# Patient Record
Sex: Female | Born: 1990 | Race: White | Hispanic: No | Marital: Married | Smoking: Current every day smoker
Health system: Southern US, Community
[De-identification: ages and names within clinical notes are randomized; demographics above are authoritative.]

## PROBLEM LIST (undated history)

## (undated) DIAGNOSIS — Q250 Patent ductus arteriosus: Secondary | ICD-10-CM

## (undated) HISTORY — PX: CARDIAC SURGERY: SHX584

## (undated) HISTORY — PX: PATENT DUCTUS ARTERIOUS REPAIR: SHX269

---

## 2013-06-25 ENCOUNTER — Observation Stay: Payer: Self-pay

## 2013-06-25 LAB — URINALYSIS, COMPLETE
Bilirubin,UR: NEGATIVE
Glucose,UR: NEGATIVE mg/dL (ref 0–75)
Ketone: NEGATIVE
Nitrite: NEGATIVE
Ph: 5 (ref 4.5–8.0)
Protein: 30
RBC,UR: 3 /HPF (ref 0–5)
Specific Gravity: 1.024 (ref 1.003–1.030)
Squamous Epithelial: 1
WBC UR: 5 /HPF (ref 0–5)

## 2013-07-01 ENCOUNTER — Emergency Department: Payer: Self-pay | Admitting: Emergency Medicine

## 2013-09-18 ENCOUNTER — Observation Stay: Payer: Self-pay

## 2013-09-27 ENCOUNTER — Inpatient Hospital Stay: Payer: Self-pay

## 2013-09-27 LAB — CBC WITH DIFFERENTIAL/PLATELET
Basophil %: 0.3 %
Lymphocyte %: 15.9 %
MCH: 29.2 pg (ref 26.0–34.0)
MCHC: 33.9 g/dL (ref 32.0–36.0)
MCV: 86 fL (ref 80–100)
Monocyte #: 1 x10 3/mm — ABNORMAL HIGH (ref 0.2–0.9)
Monocyte %: 5.5 %
Neutrophil #: 13.2 10*3/uL — ABNORMAL HIGH (ref 1.4–6.5)
Platelet: 375 10*3/uL (ref 150–440)

## 2013-09-28 LAB — GC/CHLAMYDIA PROBE AMP

## 2013-09-29 LAB — HEMATOCRIT: HCT: 27.2 % — ABNORMAL LOW (ref 35.0–47.0)

## 2014-03-10 ENCOUNTER — Emergency Department: Payer: Self-pay | Admitting: Emergency Medicine

## 2014-03-12 ENCOUNTER — Ambulatory Visit: Payer: Self-pay

## 2014-03-12 ENCOUNTER — Emergency Department: Payer: Self-pay | Admitting: Emergency Medicine

## 2014-08-11 ENCOUNTER — Emergency Department: Payer: Self-pay

## 2014-08-13 LAB — BETA STREP CULTURE(ARMC)

## 2015-02-28 ENCOUNTER — Emergency Department
Admit: 2015-02-28 | Disposition: A | Discharge status: Home / Self Care | Payer: Self-pay | Admitting: Emergency Medicine

## 2015-04-02 NOTE — H&P (Signed)
L&D Evaluation:  History:  HPI 24yo recently married WF sent over from office in active labor at 38 weeks, G3P2002, Normal PNC to date. GBS+, antibiotics administered.   Presents with contractions   Patient's Medical History PDA   Patient's Surgical History Correction of PDA at age 8   Medications Pre Serbiaatal Vitamins  Iron  Tylenol (Acetaminophen)   Allergies NKDA   Social History smoked in early pregnancy   Family History Non-Contributory   ROS:  ROS All systems were reviewed.  HEENT, CNS, GI, GU, Respiratory, CV, Renal and Musculoskeletal systems were found to be normal.   Exam:  Vital Signs stable   Urine Protein not completed   General no apparent distress   Mental Status clear   Chest clear   Heart normal sinus rhythm   Abdomen gravid, tender with contractions   Estimated Fetal Weight Average for gestational age   Fetal Position vtx   Back no CVAT   Edema no edema   Pelvic 5.5/90/0   Mebranes Intact   FHT normal rate with no decels   Ucx irregular   Ucx Frequency 5 min   Length of each Contraction 60 seconds   Ucx Pain Scale 6   Impression:  Impression active labor   Plan:  Plan monitor contractions and for cervical change, antibiotics for GBBS prophylaxis, epidural   Electronic Signatures: Ulyses AmorBurr, Effa Yarrow N (CNM)  (Signed [phone]-Nov-14 20:41)  Authored: L&D Evaluation   Last Updated: 05-Nov-14 20:41 by Ulyses AmorBurr, Dametria Tuzzolino N (CNM)

## 2015-06-10 ENCOUNTER — Telehealth: Payer: Self-pay | Admitting: Obstetrics and Gynecology

## 2015-06-10 NOTE — Telephone Encounter (Signed)
Called pt she states the valtrex 500mg  is giving her headaches, she would like to go back on acyclovir if possible Also pt is requesting Adderall 20mg  she states she is having to take 2 tabs some of the days, she is going to make appt with you to discuss, would like to pick up rx 06/11/2015

## 2015-06-10 NOTE — Telephone Encounter (Signed)
Pt called and needed a script for adderoll, I didn't see it up front, do you know if [facility] has written this RX, also pt wanted a call back, she had some questions she wanted to you about

## 2015-06-11 ENCOUNTER — Other Ambulatory Visit: Payer: Self-pay | Admitting: Obstetrics and Gynecology

## 2015-06-11 DIAGNOSIS — F988 Other specified behavioral and emotional disorders with onset usually occurring in childhood and adolescence: Secondary | ICD-10-CM

## 2015-06-11 MED ORDER — AMPHETAMINE-DEXTROAMPHETAMINE 20 MG PO TABS: 20.0000 mg | ORAL_TABLET | Freq: Two times a day (BID) | ORAL | Status: DC

## 2015-06-11 MED ORDER — ACYCLOVIR 400 MG PO TABS: 400.0000 mg | ORAL_TABLET | Freq: Two times a day (BID) | ORAL | Status: DC

## 2015-06-11 NOTE — Telephone Encounter (Signed)
Please let her know Idid prescription for acyclovir (didn't see a pharmacy in system), but cannot write for the adderral without seeing her first, and I don't find any record of seeing her here in past

## 2015-06-11 NOTE — Telephone Encounter (Signed)
This is Maureen Hill best her last ov with you was 12/18/2014 Pt was advised she needs to make office visit for follow up, she would like rx for adderall until appt

## 2015-07-14 ENCOUNTER — Ambulatory Visit
Admission: EM | Admit: 2015-07-14 | Discharge: 2015-07-14 | Disposition: A | Discharge status: Home / Self Care | Payer: Self-pay | Attending: Family Medicine | Admitting: Family Medicine

## 2015-07-14 DIAGNOSIS — H109 Unspecified conjunctivitis: Secondary | ICD-10-CM

## 2015-07-14 MED ORDER — MOXIFLOXACIN HCL 0.5 % OP SOLN: 1.0000 [drp] | Freq: Three times a day (TID) | OPHTHALMIC | Status: DC

## 2015-07-14 NOTE — ED Notes (Signed)
Patient states that she is having left eye pain. Patient states pain started yesterday. Patient states that eye is red, painful and has been draining. She states that she has been wearing her contacts more than usual recently. Does not remember getting anything in to her eye.

## 2015-07-14 NOTE — ED Provider Notes (Signed)
CSN: [id number]     Arrival date & time 07/14/15  1336 History   First MD Initiated Contact with Patient 07/14/15 1435     Chief Complaint  Patient presents with  . Eye Pain   (Consider location/radiation/quality/duration/timing/severity/associated sxs/prior Treatment) HPI Comments: 24 yo female with a 2 days h/o left eye redness, drainage and discomfort. Unknown sick contacts. Wears contact lenses. Denies any fevers, chills.  The history is provided by the patient.    No past medical history on file. Past Surgical History  Procedure Laterality Date  . Patent ductus arterious repair     No family history on file. Social History  Substance Use Topics  . Smoking status: Never Smoker   . Smokeless tobacco: None  . Alcohol Use: No   OB History    No data available     Review of Systems  Allergies  Review of patient's allergies indicates no known allergies.  Home Medications   Prior to Admission medications   Medication Sig Start Date End Date Taking? Authorizing Provider  acyclovir (ZOVIRAX) 400 MG tablet Take 1 tablet (400 mg total) by mouth 2 (two) times daily. 06/11/15  Yes Melody Elissa Lovett, CNM  amphetamine-dextroamphetamine (ADDERALL) 20 MG tablet Take 1 tablet (20 mg total) by mouth 2 (two) times daily. 06/11/15  Yes Melody Elissa Lovett, CNM  moxifloxacin (VIGAMOX) 0.5 % ophthalmic solution Place 1 drop into the left eye 3 (three) times daily. For 5 days 07/14/15   Payton Mccallum, MD   BP 95/69 mmHg  Pulse 119  Temp(Src) 98.3 F (36.8 C) (Tympanic)  Resp 16  Ht 5' 5.5" (1.664 m)  SpO2 100%  LMP 06/16/2015 Physical Exam  Constitutional: She appears well-developed and well-nourished. No distress.  Eyes: EOM are normal. Pupils are equal, round, and reactive to light. Lids are everted and swept, no foreign bodies found. Right eye exhibits no discharge. Left eye exhibits discharge. Right conjunctiva is not injected. Left conjunctiva is injected.  Neurological: She is alert.   Skin: She is not diaphoretic.  Nursing note and vitals reviewed.   ED Course  Procedures (including critical care time) Labs Review Labs Reviewed - No data to display  Imaging Review No results found.   MDM   1. Left conjunctivitis    Discharge Medication List as of 07/14/2015  2:54 PM    START taking these medications   Details  moxifloxacin (VIGAMOX) 0.5 % ophthalmic solution Place 1 drop into the left eye 3 (three) times daily. For 5 days, Starting 07/14/2015, Until Discontinued, Normal      Plan: 1.  diagnosis reviewed with patient 2. rx as per orders; risks, benefits, potential side effects reviewed with patient 3. Recommend supportive treatment with otc analgesics; no use of contact lens until condition resolved and then start with new pair of contact lenses (throw out old pair of lenses) 4. F/u prn if symptoms worsen or don't improve    Payton Mccallum, MD 07/14/15 [phone]

## 2015-07-19 ENCOUNTER — Telehealth: Payer: Self-pay | Admitting: Obstetrics and Gynecology

## 2015-07-19 NOTE — Telephone Encounter (Signed)
Needs refill for adderrall.

## 2015-07-22 NOTE — Telephone Encounter (Signed)
pls advise

## 2015-07-23 NOTE — Telephone Encounter (Signed)
Needs to be seen

## 2015-07-24 ENCOUNTER — Ambulatory Visit (INDEPENDENT_AMBULATORY_CARE_PROVIDER_SITE_OTHER): Payer: Self-pay | Admitting: Obstetrics and Gynecology

## 2015-07-24 ENCOUNTER — Encounter: Payer: Self-pay | Admitting: Obstetrics and Gynecology

## 2015-07-24 VITALS — BP 111/67 | HR 102 | Ht 66.0 in | Wt 110.4 lb

## 2015-07-24 DIAGNOSIS — F909 Attention-deficit hyperactivity disorder, unspecified type: Secondary | ICD-10-CM

## 2015-07-24 DIAGNOSIS — F988 Other specified behavioral and emotional disorders with onset usually occurring in childhood and adolescence: Secondary | ICD-10-CM

## 2015-07-24 MED ORDER — AMPHETAMINE-DEXTROAMPHETAMINE 20 MG PO TABS: 20.0000 mg | ORAL_TABLET | Freq: Two times a day (BID) | ORAL | Status: DC

## 2015-07-24 NOTE — Progress Notes (Signed)
Subjective:     Patient ID: [name], female   DOB: 11/16/1991, 24 y.o.   MRN: [id number]  HPI Here to discuss ADD medications  Review of Systems Feeling better on current dose. No complaints    Objective:   Physical Exam A&O x4 Well groomed thin female in no current distress    Assessment:     ADD under good control with current med     Plan:     Continue Adderall at  bid, rx given with 3 months RTC prn  Melody Ines Bloomer, CNM

## 2015-07-24 NOTE — Telephone Encounter (Signed)
Pt is coming in 07/24/2015

## 2015-08-06 ENCOUNTER — Encounter: Payer: Self-pay | Admitting: *Deleted

## 2015-08-06 ENCOUNTER — Emergency Department: Payer: Self-pay

## 2015-08-06 ENCOUNTER — Emergency Department
Admission: EM | Admit: 2015-08-06 | Discharge: 2015-08-06 | Disposition: A | Discharge status: Home / Self Care | Payer: Self-pay | Attending: Emergency Medicine | Admitting: Emergency Medicine

## 2015-08-06 DIAGNOSIS — Z79899 Other long term (current) drug therapy: Secondary | ICD-10-CM | POA: Insufficient documentation

## 2015-08-06 DIAGNOSIS — Z3202 Encounter for pregnancy test, result negative: Secondary | ICD-10-CM | POA: Insufficient documentation

## 2015-08-06 DIAGNOSIS — S161XXA Strain of muscle, fascia and tendon at neck level, initial encounter: Secondary | ICD-10-CM | POA: Insufficient documentation

## 2015-08-06 DIAGNOSIS — Y998 Other external cause status: Secondary | ICD-10-CM | POA: Insufficient documentation

## 2015-08-06 DIAGNOSIS — T148XXA Other injury of unspecified body region, initial encounter: Secondary | ICD-10-CM

## 2015-08-06 DIAGNOSIS — S40012A Contusion of left shoulder, initial encounter: Secondary | ICD-10-CM | POA: Insufficient documentation

## 2015-08-06 DIAGNOSIS — Y9389 Activity, other specified: Secondary | ICD-10-CM | POA: Insufficient documentation

## 2015-08-06 DIAGNOSIS — Y92410 Unspecified street and highway as the place of occurrence of the external cause: Secondary | ICD-10-CM | POA: Insufficient documentation

## 2015-08-06 DIAGNOSIS — Z792 Long term (current) use of antibiotics: Secondary | ICD-10-CM | POA: Insufficient documentation

## 2015-08-06 LAB — POCT PREGNANCY, URINE: Preg Test, Ur: NEGATIVE

## 2015-08-06 MED ORDER — IBUPROFEN 800 MG PO TABS
800.0000 mg | ORAL_TABLET | Freq: Once | ORAL | Status: AC
  Administered 2015-08-06: 800 mg via ORAL
  Filled 2015-08-06: qty 1

## 2015-08-06 MED ORDER — METHOCARBAMOL 500 MG PO TABS: 500.0000 mg | ORAL_TABLET | Freq: Four times a day (QID) | ORAL | Status: DC | PRN

## 2015-08-06 MED ORDER — IBUPROFEN 800 MG PO TABS: 800.0000 mg | ORAL_TABLET | Freq: Three times a day (TID) | ORAL | Status: DC | PRN
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
## 2015-09-29 ENCOUNTER — Encounter: Payer: Self-pay | Admitting: Gynecology

## 2015-09-29 ENCOUNTER — Ambulatory Visit
Admission: EM | Admit: 2015-09-29 | Discharge: 2015-09-29 | Disposition: A | Discharge status: Home / Self Care | Payer: Self-pay | Attending: Internal Medicine | Admitting: Internal Medicine

## 2015-09-29 DIAGNOSIS — H66002 Acute suppurative otitis media without spontaneous rupture of ear drum, left ear: Secondary | ICD-10-CM

## 2015-09-29 DIAGNOSIS — H6092 Unspecified otitis externa, left ear: Secondary | ICD-10-CM

## 2015-09-29 DIAGNOSIS — J069 Acute upper respiratory infection, unspecified: Secondary | ICD-10-CM

## 2015-09-29 HISTORY — DX: Patent ductus arteriosus: Q25.0

## 2015-09-29 LAB — RAPID INFLUENZA A&B ANTIGENS
Influenza A (ARMC): NOT DETECTED
Influenza B (ARMC): NOT DETECTED

## 2015-09-29 LAB — RAPID STREP SCREEN (MED CTR MEBANE ONLY): Streptococcus, Group A Screen (Direct): NEGATIVE

## 2015-09-29 MED ORDER — AMOXICILLIN 500 MG PO CAPS: 500.0000 mg | ORAL_CAPSULE | Freq: Two times a day (BID) | ORAL | Status: DC

## 2015-09-29 NOTE — ED Provider Notes (Signed)
CSN: [id number]     Arrival date & time 09/29/15  1250 History   First MD Initiated Contact with Patient 09/29/15 1450     Chief Complaint  Patient presents with  . Sore Throat  . Generalized Body Aches   HPI  Maureen CordiaMarissa Hill is a pleasant 24 y.o. female who presents with fever, sore throat, body aches and chills. She began to have symptoms yesterday. Her temp was 100. She reports her 26-year-old has been sick for 2 weeks and just got home from spending 4 days in the hospital. He was being treated for RSV, conjunctivitis, bronchiolitis, and pneumonia. Yesterday she started to have left ear pain and pressure along with the above symptoms. Pain is 10/10 currently. She denies abdominal pain, nausea, vomiting or rash. She has not tried any medications for her symptoms. Rest seems to make her feel a little better.  Cough is non-productive.   Past Medical History  Diagnosis Date  . PDA (patent ductus arteriosus)    Past Surgical History  Procedure Laterality Date  . Patent ductus arterious repair    . Cardiac surgery     History reviewed. No pertinent family history. Social History  Substance Use Topics  . Smoking status: Never Smoker   . Smokeless tobacco: None  . Alcohol Use: No   OB History    No data available     Review of Systems  Constitutional: Positive for fever, chills, diaphoresis, activity change, appetite change and fatigue. Negative for unexpected weight change.  HENT: Positive for congestion, ear pain, mouth sores, postnasal drip, rhinorrhea, sinus pressure, sneezing and sore throat. Negative for ear discharge, hearing loss, tinnitus and trouble swallowing.   Eyes: Negative.   Respiratory: Positive for cough.   Cardiovascular: Negative.   Gastrointestinal: Negative.   Genitourinary: Negative.   Musculoskeletal: Positive for myalgias.  Skin: Negative.   Psychiatric/Behavioral: Negative.     Allergies  Review of patient's allergies indicates no known  allergies.  Home Medications   Prior to Admission medications   Medication Sig Start Date End Date Taking? Authorizing Provider  acyclovir (ZOVIRAX) 400 MG tablet Take 1 tablet (400 mg total) by mouth 2 (two) times daily. 06/11/15  Yes Melody Elissa LovettN Burr, CNM  amphetamine-dextroamphetamine (ADDERALL) 20 MG tablet Take 1 tablet (20 mg total) by mouth 2 (two) times daily. 07/24/15  Yes Melody Elissa LovettN Burr, CNM  levonorgestrel (MIRENA) 20 MCG/24HR IUD 1 each by Intrauterine route once.   Yes Historical Provider, MD  amoxicillin (AMOXIL) 500 MG capsule Take 1 capsule (500 mg total) by mouth 2 (two) times daily. 09/29/15   Joselyn ArrowKandice L Aashna Matson, NP  ibuprofen (ADVIL,MOTRIN) 800 MG tablet Take 1 tablet (800 mg total) by mouth every 8 (eight) hours as needed. 08/06/15   Charmayne Sheerharles M Beers, PA-C  methocarbamol (ROBAXIN) 500 MG tablet Take 1 tablet (500 mg total) by mouth every 6 (six) hours as needed for muscle spasms. 08/06/15   Charmayne Sheerharles M Beers, PA-C  moxifloxacin (VIGAMOX) 0.5 % ophthalmic solution Place 1 drop into the left eye 3 (three) times daily. For 5 days 07/14/15   Payton Mccallumrlando Conty, MD   Meds Ordered and Administered this Visit  Medications - No data to display  BP 99/62 mmHg  Pulse 103  Temp(Src) 99.3 F (37.4 C) (Oral)  Resp 16  Ht 5\' 6"  (1.676 m)  Wt 115 lb (52.164 kg)  BMI 18.57 kg/m2  SpO2 100%  LMP 09/20/2015 No data found.   Physical Exam  Constitutional: She is oriented  to person, place, and time. She appears well-developed and well-nourished. No distress.  HENT:  Head: Normocephalic and atraumatic.  Right Ear: Hearing, tympanic membrane, external ear and ear canal normal.  Left Ear: Hearing and external ear normal. Tympanic membrane is injected, erythematous and bulging.  Nose: Mucosal edema and rhinorrhea present. Right sinus exhibits no maxillary sinus tenderness and no frontal sinus tenderness. Left sinus exhibits no maxillary sinus tenderness and no frontal sinus tenderness.  Mouth/Throat:  Uvula is midline and mucous membranes are normal. Posterior oropharyngeal erythema present. No oropharyngeal exudate or posterior oropharyngeal edema.  Left canal with beefy red erythema  Eyes: Conjunctivae are normal. Pupils are equal, round, and reactive to light. No scleral icterus.  Neck: Normal range of motion. Neck supple. No thyromegaly present.  Shotty tender anterior cervical adenopathy  Cardiovascular: Normal rate and regular rhythm.   Pulmonary/Chest: Effort normal and breath sounds normal. No respiratory distress. She has no wheezes. She has no rales.  Abdominal: Soft. Bowel sounds are normal. She exhibits no distension and no mass. There is no tenderness. There is no rebound and no guarding.  Musculoskeletal: Normal range of motion. She exhibits no edema or tenderness.  Neurological: She is alert and oriented to person, place, and time. No cranial nerve deficit.  Skin: Skin is warm and dry. No rash noted. No erythema.  Psychiatric: She has a normal mood and affect. Her behavior is normal. Judgment and thought content normal.  Nursing note and vitals reviewed.   ED Course  Procedures none  Labs Review Labs Reviewed  RAPID STREP SCREEN (NOT AT [facility])  RAPID INFLUENZA A&B ANTIGENS ([facility] ONLY)  CULTURE, GROUP A STREP ([facility] ONLY)   strep negative, influenza A and B negative MDM   1. Otitis externa, left   2. Acute suppurative otitis media of left ear without spontaneous rupture of tympanic membrane, recurrence not specified   3. URI (upper respiratory infection)    Plan: Test results and diagnosis reviewed with patient  Rx as per orders;  benefits, risks, potential side effects reviewed  Recommend supportive treatment with rest, fluids, tylenol as directed, Mucinex 1-2 tabs BID Seek additional medical care if symptoms worsen or are not improving    Joselyn Arrow, NP 09/29/15 1619

## 2015-09-29 NOTE — Discharge Instructions (Signed)
Rest, increase fluid intake, Tylenol as needed for fever and pain Mucinex one to 2 tablets twice daily for congestion  Otitis Externa Otitis externa is a germ infection in the outer ear. The outer ear is the area from the eardrum to the outside of the ear. Otitis externa is sometimes called "swimmer's ear." HOME CARE  Put drops in the ear as told by your doctor.  Only take medicine as told by your doctor.  If you have diabetes, your doctor may give you more directions. Follow your doctor's directions.  Keep all doctor visits as told. To avoid another infection:  Keep your ear dry. Use the corner of a towel to dry your ear after swimming or bathing.  Avoid scratching or putting things inside your ear.  Avoid swimming in lakes, dirty water, or pools that use a chemical called chlorine poorly.  You may use ear drops after swimming. Combine equal amounts of white vinegar and alcohol in a bottle. Put 3 or 4 drops in each ear. GET HELP IF:   You have a fever.  Your ear is still red, puffy (swollen), or painful after 3 days.  You still have yellowish-white fluid (pus) coming from the ear after 3 days.  Your redness, puffiness, or pain gets worse.  You have a really bad headache.  You have redness, puffiness, pain, or tenderness behind your ear. MAKE SURE YOU:   Understand these instructions.  Will watch your condition.  Will get help right away if you are not doing well or get worse.   This information is not intended to replace advice given to you by your health care provider. Make sure you discuss any questions you have with your health care provider.   Document Released: 04/27/2008 Document Revised: 11/30/2014 Document Reviewed: 11/26/2011 Elsevier Interactive Patient Education 2016 [employer]  Otitis Media With Effusion Otitis media with effusion is the presence of fluid in the middle ear. This is a common problem in children, which often follows ear infections. It  may be present for weeks or longer after the infection. Unlike an acute ear infection, otitis media with effusion refers only to fluid behind the ear drum and not infection. Children with repeated ear and sinus infections and allergy problems are the most likely to get otitis media with effusion. CAUSES  The most frequent cause of the fluid buildup is dysfunction of the eustachian tubes. These are the tubes that drain fluid in the ears to the back of the nose (nasopharynx). SYMPTOMS   The main symptom of this condition is hearing loss. As a result, you or your child may:  Listen to the TV at a loud volume.  Not respond to questions.  Ask "what" often when spoken to.  Mistake or confuse one sound or word for another.  There may be a sensation of fullness or pressure but usually not pain. DIAGNOSIS   Your health care provider will diagnose this condition by examining you or your child's ears.  Your health care provider may test the pressure in you or your child's ear with a tympanometer.  A hearing test may be conducted if the problem persists. TREATMENT   Treatment depends on the duration and the effects of the effusion.  Antibiotics, decongestants, nose drops, and cortisone-type drugs (tablets or nasal spray) may not be helpful.  Children with persistent ear effusions may have delayed language or behavioral problems. Children at risk for developmental delays in hearing, learning, and speech may require referral to a  specialist earlier than children not at risk.  You or your child's health care provider may suggest a referral to an ear, nose, and throat surgeon for treatment. The following may help restore normal hearing:  Drainage of fluid.  Placement of ear tubes (tympanostomy tubes).  Removal of adenoids (adenoidectomy). HOME CARE INSTRUCTIONS   Avoid secondhand smoke.  Infants who are breastfed are less likely to have this condition.  Avoid feeding infants while they  are lying flat.  Avoid known environmental allergens.  Avoid people who are sick. SEEK MEDICAL CARE IF:   Hearing is not better in 3 months.  Hearing is worse.  Ear pain.  Drainage from the ear.  Dizziness. MAKE SURE YOU:   Understand these instructions.  Will watch your condition.  Will get help right away if you are not doing well or get worse.   This information is not intended to replace advice given to you by your health care provider. Make sure you discuss any questions you have with your health care provider.   Document Released: 12/17/2004 Document Revised: 11/30/2014 Document Reviewed: 06/06/2013 Elsevier Interactive Patient Education [employer]

## 2015-09-29 NOTE — ED Notes (Signed)
Patient c/o body aces/ chills/ fever at home of 100 and sore throat x yesterday.

## 2015-10-01 NOTE — ED Notes (Signed)
Final report of strep testing negative  

## 2015-10-02 LAB — CULTURE, GROUP A STREP (THRC)

## 2015-10-16 ENCOUNTER — Other Ambulatory Visit: Payer: Self-pay | Admitting: Obstetrics and Gynecology

## 2015-10-16 ENCOUNTER — Telehealth: Payer: Self-pay | Admitting: Obstetrics and Gynecology

## 2015-10-16 MED ORDER — AMPHETAMINE-DEXTROAMPHETAMINE 20 MG PO TABS: 20.0000 mg | ORAL_TABLET | Freq: Two times a day (BID) | ORAL | Status: DC

## 2015-10-16 NOTE — Telephone Encounter (Signed)
This pt said she picks up a RX up front. I could not hear her well on the phone so I don't know what it is. Sorry She said she usually picks it up around the 30th.

## 2015-10-16 NOTE — Telephone Encounter (Signed)
Please let her know it is ready for pick up 

## 2015-10-21 ENCOUNTER — Other Ambulatory Visit: Payer: Self-pay | Admitting: *Deleted

## 2015-10-21 MED ORDER — NITROFURANTOIN MONOHYD MACRO 100 MG PO CAPS: 100.0000 mg | ORAL_CAPSULE | Freq: Two times a day (BID) | ORAL | Status: DC

## 2015-10-24 ENCOUNTER — Ambulatory Visit: Payer: Self-pay | Admitting: Obstetrics and Gynecology

## 2015-10-25 ENCOUNTER — Ambulatory Visit: Payer: Self-pay | Admitting: Obstetrics and Gynecology

## 2015-10-30 ENCOUNTER — Ambulatory Visit: Payer: Self-pay | Admitting: Obstetrics and Gynecology

## 2015-12-07 ENCOUNTER — Ambulatory Visit
Admission: EM | Admit: 2015-12-07 | Discharge: 2015-12-07 | Disposition: A | Discharge status: Home / Self Care | Payer: Self-pay | Attending: Family Medicine | Admitting: Family Medicine

## 2015-12-07 DIAGNOSIS — H6501 Acute serous otitis media, right ear: Secondary | ICD-10-CM

## 2015-12-07 MED ORDER — AMOXICILLIN 875 MG PO TABS: 875.0000 mg | ORAL_TABLET | Freq: Two times a day (BID) | ORAL | Status: DC

## 2015-12-07 NOTE — ED Provider Notes (Signed)
CSN: [id number]     Arrival date & time 12/07/15  1047 History   First MD Initiated Contact with Patient 12/07/15 1324     Chief Complaint  Patient presents with  . URI   (Consider location/radiation/quality/duration/timing/severity/associated sxs/prior Treatment) Patient is a 25 y.o. female presenting with URI. The history is provided by the patient.  URI Presenting symptoms: congestion, cough, ear pain, fever and rhinorrhea   Severity:  Moderate Onset quality:  Sudden Duration:  2 weeks Timing:  Constant Progression:  Worsening Chronicity:  New Relieved by:  Nothing Ineffective treatments:  OTC medications   Past Medical History  Diagnosis Date  . PDA (patent ductus arteriosus)    Past Surgical History  Procedure Laterality Date  . Patent ductus arterious repair    . Cardiac surgery     No family history on file. Social History  Substance Use Topics  . Smoking status: Never Smoker   . Smokeless tobacco: None  . Alcohol Use: No   OB History    No data available     Review of Systems  Constitutional: Positive for fever.  HENT: Positive for congestion, ear pain and rhinorrhea.   Respiratory: Positive for cough.     Allergies  Review of patient's allergies indicates no known allergies.  Home Medications   Prior to Admission medications   Medication Sig Start Date End Date Taking? Authorizing Provider  acyclovir (ZOVIRAX) 400 MG tablet Take 1 tablet (400 mg total) by mouth 2 (two) times daily. 06/11/15  Yes Melody N Shambley, CNM  amphetamine-dextroamphetamine (ADDERALL) 20 MG tablet Take 1 tablet (20 mg total) by mouth 2 (two) times daily. 10/16/15  Yes Melody Suzan Nailer, CNM  levonorgestrel (MIRENA) 20 MCG/24HR IUD 1 each by Intrauterine route once.   Yes Historical Provider, MD  amoxicillin (AMOXIL) 875 MG tablet Take 1 tablet (875 mg total) by mouth 2 (two) times daily. 12/07/15   Payton Mccallum, MD  ibuprofen (ADVIL,MOTRIN) 800 MG tablet Take 1 tablet (800 mg  total) by mouth every 8 (eight) hours as needed. 08/06/15   Charmayne Sheer Beers, PA-C  methocarbamol (ROBAXIN) 500 MG tablet Take 1 tablet (500 mg total) by mouth every 6 (six) hours as needed for muscle spasms. 08/06/15   Charmayne Sheer Beers, PA-C  moxifloxacin (VIGAMOX) 0.5 % ophthalmic solution Place 1 drop into the left eye 3 (three) times daily. For 5 days 07/14/15   Payton Mccallum, MD  nitrofurantoin, macrocrystal-monohydrate, (MACROBID) 100 MG capsule Take 1 capsule (100 mg total) by mouth 2 (two) times daily. 10/21/15   Melody Suzan Nailer, CNM   Meds Ordered and Administered this Visit  Medications - No data to display  BP 106/75 mmHg  Pulse 85  Temp(Src) 98.2 F (36.8 C) (Oral)  Resp 16  Ht 5\' 5"  (1.651 m)  Wt 105 lb (47.628 kg)  BMI 17.47 kg/m2  SpO2 100% No data found.   Physical Exam  Constitutional: She appears well-developed and well-nourished. No distress.  HENT:  Head: Normocephalic and atraumatic.  Right Ear: External ear and ear canal normal. Tympanic membrane is injected and bulging. A middle ear effusion is present.  Left Ear: Tympanic membrane, external ear and ear canal normal.  Nose: Rhinorrhea present. No mucosal edema, nose lacerations, sinus tenderness, nasal deformity, septal deviation or nasal septal hematoma. No epistaxis.  No foreign bodies.  Mouth/Throat: Uvula is midline and mucous membranes are normal. Posterior oropharyngeal erythema present. No oropharyngeal exudate, posterior oropharyngeal edema or tonsillar abscesses.  Eyes: Conjunctivae  and EOM are normal. Pupils are equal, round, and reactive to light. Right eye exhibits no discharge. Left eye exhibits no discharge. No scleral icterus.  Neck: Normal range of motion. Neck supple. No thyromegaly present.  Cardiovascular: Normal rate, regular rhythm and normal heart sounds.   Pulmonary/Chest: Effort normal and breath sounds normal. No respiratory distress. She has no wheezes. She has no rales.  Lymphadenopathy:     She has no cervical adenopathy.  Skin: She is not diaphoretic.  Nursing note and vitals reviewed.   ED Course  Procedures (including critical care time)  Labs Review Labs Reviewed - No data to display  Imaging Review No results found.   Visual Acuity Review  Right Eye Distance:   Left Eye Distance:   Bilateral Distance:    Right Eye Near:   Left Eye Near:    Bilateral Near:         MDM   1. Right acute serous otitis media, recurrence not specified    Discharge Medication List as of 12/07/2015  1:49 PM    START taking these medications   Details  amoxicillin (AMOXIL) 875 MG tablet Take 1 tablet (875 mg total) by mouth 2 (two) times daily., Starting 12/07/2015, Until Discontinued, Normal       1. diagnosis reviewed with patient 2. rx as per orders above; reviewed possible side effects, interactions, risks and benefits  3. Recommend supportive treatment with otc analgesics 4. Follow-up prn if symptoms worsen or don't improve    Payton Mccallumrlando Judiann Celia, MD 12/07/15 1438

## 2015-12-07 NOTE — ED Notes (Signed)
X 2 weeks. Bilateral otalgia. Dry cough, hoarseness, chest congestion. Pt states she was febrile "a couple days ago"; temperature was 101.

## 2016-01-30 ENCOUNTER — Telehealth: Payer: Self-pay | Admitting: Obstetrics and Gynecology

## 2016-01-30 ENCOUNTER — Other Ambulatory Visit: Payer: Self-pay | Admitting: Obstetrics and Gynecology

## 2016-01-30 MED ORDER — AMPHETAMINE-DEXTROAMPHETAMINE 20 MG PO TABS: 20.0000 mg | ORAL_TABLET | Freq: Two times a day (BID) | ORAL | Status: DC

## 2016-01-30 NOTE — Telephone Encounter (Signed)
pls advise

## 2016-01-30 NOTE — Telephone Encounter (Signed)
Maureen Hill needs her adderall refill, she asked could pick up today?

## 2016-09-30 ENCOUNTER — Ambulatory Visit
Admission: EM | Admit: 2016-09-30 | Discharge: 2016-09-30 | Disposition: A | Discharge status: Home / Self Care | Payer: Medicaid Other | Attending: Family Medicine | Admitting: Family Medicine

## 2016-09-30 ENCOUNTER — Encounter: Payer: Self-pay | Admitting: Emergency Medicine

## 2016-09-30 DIAGNOSIS — J0100 Acute maxillary sinusitis, unspecified: Secondary | ICD-10-CM

## 2016-09-30 MED ORDER — AMOXICILLIN-POT CLAVULANATE 875-125 MG PO TABS: 1.0000 | ORAL_TABLET | Freq: Two times a day (BID) | ORAL | 0 refills | Status: AC

## 2016-09-30 NOTE — ED Provider Notes (Signed)
CSN: [id number]     Arrival date & time 09/30/16  1310 History   First MD Initiated Contact with Patient 09/30/16 1326     Chief Complaint  Patient presents with  . Facial Pain  . Cough   (Consider location/radiation/quality/duration/timing/severity/associated sxs/prior Treatment) 25 year old female presents with nasal congestion, sinus pain and pressure, sore throat, ear pressure, cough and congestion as well as nausea and 1 episode of vomiting yesterday. Denies any fever. Has taken Theraflu and Sudafed with no relief.    The history is provided by the patient.    Past Medical History:  Diagnosis Date  . PDA (patent ductus arteriosus)    Past Surgical History:  Procedure Laterality Date  . CARDIAC SURGERY    . PATENT DUCTUS ARTERIOUS REPAIR     History reviewed. No pertinent family history. Social History  Substance Use Topics  . Smoking status: Never Smoker  . Smokeless tobacco: Never Used  . Alcohol use No   OB History    No data available     Review of Systems  Constitutional: Positive for fatigue. Negative for appetite change, chills and fever.  HENT: Positive for congestion, ear pain, sinus pain, sinus pressure and sore throat. Negative for rhinorrhea and sneezing.   Respiratory: Positive for cough. Negative for chest tightness, shortness of breath and wheezing.   Cardiovascular: Negative for chest pain.  Gastrointestinal: Positive for diarrhea, nausea and vomiting. Negative for abdominal pain and blood in stool.  Musculoskeletal: Negative for arthralgias, neck pain and neck stiffness.  Neurological: Positive for headaches. Negative for dizziness, syncope, weakness, light-headedness and numbness.  Hematological: Negative for adenopathy.    Allergies  Patient has no known allergies.  Home Medications   Prior to Admission medications   Medication Sig Start Date End Date Taking? Authorizing Provider  acyclovir (ZOVIRAX) 400 MG tablet Take 1 tablet (400 mg  total) by mouth 2 (two) times daily. 06/11/15   Melody N Shambley, CNM  amoxicillin-clavulanate (AUGMENTIN) 875-125 MG tablet Take 1 tablet by mouth every 12 (twelve) hours. 09/30/16 10/07/16  Sudie GrumblingAnn Berry Naquan Garman, NP  ibuprofen (ADVIL,MOTRIN) 800 MG tablet Take 1 tablet (800 mg total) by mouth every 8 (eight) hours as needed. 08/06/15   Evangeline Dakinharles M Beers, PA-C  levonorgestrel (MIRENA) 20 MCG/24HR IUD 1 each by Intrauterine route once.    Historical Provider, MD   Meds Ordered and Administered this Visit  Medications - No data to display  BP 109/70 (BP Location: Left Arm)   Pulse 100   Temp 98.1 F (36.7 C) (Oral)   Resp 16   Ht 5\' 5"  (1.651 m)   Wt 115 lb (52.2 kg)   SpO2 100%   BMI 19.14 kg/m  No data found.   Physical Exam  Constitutional: She is oriented to person, place, and time. She appears well-developed and well-nourished. No distress.  HENT:  Head: Normocephalic and atraumatic.  Right Ear: Hearing, tympanic membrane, external ear and ear canal normal.  Left Ear: Hearing, tympanic membrane, external ear and ear canal normal.  Nose: Mucosal edema and rhinorrhea present. Right sinus exhibits maxillary sinus tenderness. Right sinus exhibits no frontal sinus tenderness. Left sinus exhibits maxillary sinus tenderness. Left sinus exhibits no frontal sinus tenderness.  Mouth/Throat: Uvula is midline and mucous membranes are normal. Posterior oropharyngeal erythema present. No oropharyngeal exudate or posterior oropharyngeal edema.  Neck: Normal range of motion. Neck supple.  Cardiovascular: Normal rate, regular rhythm and normal heart sounds.   Pulmonary/Chest: Effort normal and breath  sounds normal. She has no decreased breath sounds. She has no wheezes. She has no rhonchi. She has no rales. She exhibits no tenderness.  Lymphadenopathy:    She has no cervical adenopathy.  Neurological: She is alert and oriented to person, place, and time.  Skin: Skin is warm and dry. Capillary refill  takes less than 2 seconds.  Psychiatric: She has a normal mood and affect. Her behavior is normal. Judgment and thought content normal.    Urgent Care Course   Clinical Course     Procedures (including critical care time)  Labs Review Labs Reviewed - No data to display  Imaging Review No results found.   Visual Acuity Review  Right Eye Distance:   Left Eye Distance:   Bilateral Distance:    Right Eye Near:   Left Eye Near:    Bilateral Near:         MDM   1. Acute non-recurrent maxillary sinusitis    Recommend start Augmentin twice a day as directed. Take Mucinex 600mg  twice a day as needed for congestion. Increase fluid intake to help loosen mucus. May use OTC Flonase as directed. Take Ibuprofen 600mg  every 8 hours as needed for pain. Follow-up with a primary care provider if not improving in 4 to 5 days.     Sudie GrumblingAnn Berry Jermanie Minshall, NP 09/30/16 1409

## 2016-09-30 NOTE — ED Triage Notes (Signed)
Patient c/o cough, chest congestion, sinus pressure, and ear pain for a week.

## 2016-09-30 NOTE — Discharge Instructions (Signed)
Start Augmentin twice a day as directed. Take Mucinex 600mg  twice a day as needed for congestion. May use OTC Flonase as directed. Take Ibuprofen 600mg  every 8 hours as needed for pain. Follow-up with a primary care provider if not improving in 4 to 5 days.

## 2016-10-20 ENCOUNTER — Encounter: Payer: Self-pay | Admitting: *Deleted

## 2016-10-20 ENCOUNTER — Ambulatory Visit
Admission: EM | Admit: 2016-10-20 | Discharge: 2016-10-20 | Disposition: A | Discharge status: Home / Self Care | Payer: Self-pay | Attending: Emergency Medicine | Admitting: Emergency Medicine

## 2016-10-20 DIAGNOSIS — B354 Tinea corporis: Secondary | ICD-10-CM

## 2016-10-20 MED ORDER — KETOCONAZOLE 2 % EX CREA: 1.0000 "application " | TOPICAL_CREAM | Freq: Two times a day (BID) | CUTANEOUS | 1 refills | Status: DC

## 2016-10-20 MED ORDER — TERBINAFINE HCL 250 MG PO TABS
250.0000 mg | ORAL_TABLET | Freq: Every day | ORAL | 0 refills | Status: AC
Start: 2016-10-20 — End: 2016-11-03

## 2016-10-20 NOTE — ED Provider Notes (Signed)
HPI  SUBJECTIVE:  Bascom LevelsMarissa Shutter is a 25 y.o. female who presents with a flat annular rash starting on her abdomen and back and spreading 5-6 days ago. It is not itchy or painful. She was on Augmentin 3 weeks ago and finished it 2 weeks ago, but has been fine since then. She tried calamine, Benadryl, non-scented soaps. There are no aggravating or alleviating factors. She denies any other new lotions, soaps, detergents, foods. No fevers, bodyaches, no blood on the bedclothes in the morning, sensation being bitten at night. No pets in the house. No contacts with similar rash. 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 LMP: 11/1. Denies the possibility of being pregnant. PMD: None.    Past Medical History:  Diagnosis Date  . PDA (patent ductus arteriosus)     Past Surgical History:  Procedure Laterality Date  . CARDIAC SURGERY    . PATENT DUCTUS ARTERIOUS REPAIR      History reviewed. No pertinent family history.  Social History  Substance Use Topics  . Smoking status: Never Smoker  . Smokeless tobacco: Never Used  . Alcohol use No    No current facility-administered medications for this encounter.   Current Outpatient Prescriptions:  .  ibuprofen (ADVIL,MOTRIN) 800 MG tablet, Take 1 tablet (800 mg total) by mouth every 8 (eight) hours as needed., Disp: 30 tablet, Rfl: 0 .  levonorgestrel (MIRENA) 20 MCG/24HR IUD, 1 each by Intrauterine route once., Disp: , Rfl:  .  acyclovir (ZOVIRAX) 400 MG tablet, Take 1 tablet (400 mg total) by mouth 2 (two) times daily., Disp: 60 tablet, Rfl: 1 .  ketoconazole (NIZORAL) 2 % cream, Apply 1 application topically 2 (two) times daily. Apply bid until rash resolved and for 1 week after, Disp: 60 g, Rfl: 1 .  terbinafine (LAMISIL) 250 MG tablet, Take 1 tablet (250 mg total) by mouth daily., Disp: 14 tablet, Rfl: 0  No Known Allergies   ROS  As noted in HPI.   Physical  Exam  BP 111/73 (BP Location: Left Arm)   Pulse (!) 107   Temp 98.9 F (37.2 C) (Oral)   Resp 16   Ht 5\' 6"  (1.676 m)   Wt 120 lb (54.4 kg)   SpO2 100%   BMI 19.37 kg/m   Constitutional: Well developed, well nourished, no acute distress Eyes:  EOMI, conjunctiva normal bilaterally HENT: Normocephalic, atraumatic,mucus membranes moist Respiratory: Normal inspiratory effort Cardiovascular: Normal rate GI: nondistended skin: Flat annular scaly erythematous rash with central clearing consistent with ringworm over abdomen, back, torso. No rash noted over arms. See pictures        Musculoskeletal: no deformities Neurologic: Alert & oriented x 3, no focal neuro deficits Psychiatric: Speech and behavior appropriate   ED Course   Medications - No data to display  No orders of the defined types were placed in this encounter.   No results found for this or any previous visit (from the past 24 hour(s)). No results found.  ED Clinical Impression  Tinea corporis   ED Assessment/Plan  Presentation most consistent with tinea corporis. Given its widespread distribution, will send home with topical and oral medications. We'll send home with topical ketoconazole twice a day for 4 weeks or in for 1 week until after it heals, and terbinafine 250 mg by mouth daily for 2 weeks. Follow Up with PMD of choice as needed. Providing primary care referral  Discussed MDM, plan  and followup with patient. Discussed sn/sx that should prompt return to the ED. Patient agrees with plan.   Meds ordered this encounter  Medications  . terbinafine (LAMISIL) 250 MG tablet    Sig: Take 1 tablet (250 mg total) by mouth daily.    Dispense:  14 tablet    Refill:  0  . ketoconazole (NIZORAL) 2 % cream    Sig: Apply 1 application topically 2 (two) times daily. Apply bid until rash resolved and for 1 week after    Dispense:  60 g    Refill:  1    *This clinic note was created using [employer] dictation  software. Therefore, there may be occasional mistakes despite careful proofreading.  ?   Domenick GongAshley Beckhem Isadore, MD 10/22/16 [phone]

## 2016-10-20 NOTE — ED Triage Notes (Signed)
Rash to back and abd since last week.

## 2016-10-20 NOTE — Discharge Instructions (Signed)
Apply the cream until the rash resolves and for 1 week after to make sure that it is completely gone. The oral medication will help as well.
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
## 2017-12-04 ENCOUNTER — Other Ambulatory Visit: Payer: Self-pay

## 2017-12-04 ENCOUNTER — Encounter: Payer: Self-pay | Admitting: Gynecology

## 2017-12-04 ENCOUNTER — Ambulatory Visit
Admission: EM | Admit: 2017-12-04 | Discharge: 2017-12-04 | Disposition: A | Discharge status: Home / Self Care | Payer: Self-pay | Attending: Family Medicine | Admitting: Family Medicine

## 2017-12-04 DIAGNOSIS — N3000 Acute cystitis without hematuria: Secondary | ICD-10-CM

## 2017-12-04 DIAGNOSIS — R350 Frequency of micturition: Secondary | ICD-10-CM

## 2017-12-04 DIAGNOSIS — R300 Dysuria: Secondary | ICD-10-CM

## 2017-12-04 LAB — URINALYSIS, COMPLETE (UACMP) WITH MICROSCOPIC

## 2017-12-04 MED ORDER — NITROFURANTOIN MONOHYD MACRO 100 MG PO CAPS: 100.0000 mg | ORAL_CAPSULE | Freq: Two times a day (BID) | ORAL | 0 refills | Status: DC

## 2017-12-04 NOTE — ED Provider Notes (Signed)
[facility]    CSN: [id number] Arrival date & time: 12/04/17  1223   History   Chief Complaint Chief Complaint  Patient presents with  . Urinary Tract Infection   HPI  27 year old female presents with concern for UTI.  Patient reports that she has had a few days of frequency, urgency, and dysuria.  She states that her urine is cloudy.  It has had an odor as well.  Her pain is currently 7/10 in severity.  No back pain, flank pain, or abdominal pain.  No fevers or chills.  No nausea vomiting.  She has taken Azo without significant improvement.  No known exacerbating factors.  No other complaints at this time.  Past Medical History:  Diagnosis Date  . PDA (patent ductus arteriosus)    Patient Active Problem List   Diagnosis Date Noted  . ADD (attention deficit disorder) 06/11/2015   Past Surgical History:  Procedure Laterality Date  . CARDIAC SURGERY    . PATENT DUCTUS ARTERIOUS REPAIR      OB History    No data available     Home Medications    Prior to Admission medications   Medication Sig Start Date End Date Taking? Authorizing Provider  levonorgestrel (MIRENA) 20 MCG/24HR IUD 1 each by Intrauterine route once.   Yes [provider]  ibuprofen (ADVIL,MOTRIN) 800 MG tablet Take 1 tablet (800 mg total) by mouth every 8 (eight) hours as needed. 08/06/15   Beers, Charmayne Sheer, PA-C  nitrofurantoin, macrocrystal-monohydrate, (MACROBID) 100 MG capsule Take 1 capsule (100 mg total) by mouth 2 (two) times daily. 12/04/17   Tommie Sams, DO    Family History History reviewed. No pertinent family history.  Social History Social History   Tobacco Use  . Smoking status: Never Smoker  . Smokeless tobacco: Never Used  Substance Use Topics  . Alcohol use: No    Alcohol/week: 0.0 oz  . Drug use: No     Allergies   Patient has no known allergies.   Review of Systems Review of Systems  Constitutional: Negative.   Gastrointestinal: Negative for  abdominal pain, nausea and vomiting.  Genitourinary: Positive for dysuria, frequency and urgency.       Cloudy urine with odor.   Physical Exam Triage Vital Signs ED Triage Vitals  Enc Vitals Group     BP 12/04/17 1235 107/68     Pulse Rate 12/04/17 1235 89     Resp 12/04/17 1235 16     Temp 12/04/17 1235 97.9 F (36.6 C)     Temp Source 12/04/17 1235 Oral     SpO2 12/04/17 1235 98 %     Weight 12/04/17 1236 130 lb (59 kg)     Height 12/04/17 1236 5\' 5"  (1.651 m)     Head Circumference --      Peak Flow --      Pain Score 12/04/17 1236 7     Pain Loc --      Pain Edu? --      Excl. in GC? --    Updated Vital Signs BP 107/68 (BP Location: Left Arm)   Pulse 89   Temp 97.9 F (36.6 C) (Oral)   Resp 16   Ht 5\' 5"  (1.651 m)   Wt 130 lb (59 kg)   SpO2 98%   BMI 21.63 kg/m   Physical Exam  Constitutional: She is oriented to person, place, and time. She appears well-developed and well-nourished. No distress.  Cardiovascular: Normal  rate and regular rhythm.  No murmur heard. Pulmonary/Chest: Effort normal and breath sounds normal. No respiratory distress. She has no wheezes. She has no rales.  Abdominal: Soft. She exhibits no distension. There is no tenderness.  Neurological: She is alert and oriented to person, place, and time.  Psychiatric: She has a normal mood and affect. Her behavior is normal.  Nursing note and vitals reviewed.  UC Treatments / Results  Labs (all labs ordered are listed, but only abnormal results are displayed) Labs Reviewed  URINALYSIS, COMPLETE (UACMP) WITH MICROSCOPIC - Abnormal; Notable for the following components:      Result Value   Color, Urine ORANGE (*)    APPearance CLOUDY (*)    Glucose, UA   (*)    Value: TEST NOT REPORTED DUE TO COLOR INTERFERENCE OF URINE PIGMENT   Hgb urine dipstick   (*)    Value: TEST NOT REPORTED DUE TO COLOR INTERFERENCE OF URINE PIGMENT   Bilirubin Urine   (*)    Value: TEST NOT REPORTED DUE TO COLOR  INTERFERENCE OF URINE PIGMENT   Ketones, ur   (*)    Value: TEST NOT REPORTED DUE TO COLOR INTERFERENCE OF URINE PIGMENT   Protein, ur   (*)    Value: TEST NOT REPORTED DUE TO COLOR INTERFERENCE OF URINE PIGMENT   Nitrite   (*)    Value: TEST NOT REPORTED DUE TO COLOR INTERFERENCE OF URINE PIGMENT   Leukocytes, UA   (*)    Value: TEST NOT REPORTED DUE TO COLOR INTERFERENCE OF URINE PIGMENT   Squamous Epithelial / LPF 0-5 (*)    Bacteria, UA RARE (*)    All other components within normal limits  URINE CULTURE    EKG  EKG Interpretation None       Radiology No results found.  Procedures Procedures (including critical care time)  Medications Ordered in UC Medications - No data to display   Initial Impression / Assessment and Plan / UC Course  I have reviewed the triage vital signs and the nursing notes.  Pertinent labs & imaging results that were available during my care of the patient were reviewed by me and considered in my medical decision making (see chart for details).     27 year old female presents with UTI symptoms.  Urinalysis with pyuria on microscopy.  This is consistent with UTI (new acute problem).  Sending culture.  Starting on Macrobid.  Final Clinical Impressions(s) / UC Diagnoses   Final diagnoses:  Acute cystitis without hematuria    ED Discharge Orders        Ordered    nitrofurantoin, macrocrystal-monohydrate, (MACROBID) 100 MG capsule  2 times daily     12/04/17 1258     Controlled Substance Prescriptions [place] Controlled Substance Registry consulted? Not Applicable   Tommie SamsCook, Jamiyah Dingley G, DO 12/04/17 1302

## 2017-12-04 NOTE — ED Triage Notes (Signed)
Patient c/o burning and frequent urination x couple days. 

## 2017-12-06 LAB — URINE CULTURE

## 2018-10-14 ENCOUNTER — Encounter: Payer: Self-pay | Admitting: Emergency Medicine

## 2018-10-14 ENCOUNTER — Other Ambulatory Visit: Payer: Self-pay

## 2018-10-14 ENCOUNTER — Ambulatory Visit
Admission: EM | Admit: 2018-10-14 | Discharge: 2018-10-14 | Disposition: A | Discharge status: Home / Self Care | Payer: Self-pay | Attending: Family Medicine | Admitting: Family Medicine

## 2018-10-14 DIAGNOSIS — M26623 Arthralgia of bilateral temporomandibular joint: Secondary | ICD-10-CM

## 2018-10-14 MED ORDER — ORPHENADRINE CITRATE 30 MG/ML IJ SOLN
60.0000 mg | Freq: Once | INTRAMUSCULAR | Status: AC
  Administered 2018-10-14: 60 mg via INTRAMUSCULAR

## 2018-10-14 MED ORDER — CYCLOBENZAPRINE HCL 10 MG PO TABS: 10.0000 mg | ORAL_TABLET | Freq: Every evening | ORAL | 0 refills | Status: DC | PRN

## 2018-10-14 MED ORDER — KETOROLAC TROMETHAMINE 30 MG/ML IJ SOLN
30.0000 mg | Freq: Once | INTRAMUSCULAR | Status: AC
  Administered 2018-10-14: 30 mg via INTRAMUSCULAR

## 2018-10-14 MED ORDER — MELOXICAM 15 MG PO TABS: 15.0000 mg | ORAL_TABLET | Freq: Every day | ORAL | 0 refills | Status: DC | PRN

## 2018-10-14 NOTE — ED Notes (Signed)
Patient shows no signs of adverse reaction to medication at this time.  

## 2018-10-14 NOTE — ED Triage Notes (Signed)
Patient c/o tightness and pain on both sides of her jaw that started yesterday.  Patient reports that it is causing her to have a HA.

## 2018-10-14 NOTE — Discharge Instructions (Signed)
Take medication as prescribed. Rest. Drink plenty of fluids. Use mouth guard.Follow up with your dentist as soon as possible.   Follow up with your primary care physician this week as needed. Return to Urgent care for new or worsening concerns.

## 2018-10-14 NOTE — ED Provider Notes (Signed)
[facility] ____________________________________________  Time seen: Approximately 6:16 PM  I have reviewed the triage vital signs and the nursing notes.   HISTORY  Chief Complaint Jaw Pain   HPI Maureen Hill is a 27 y.o. female present with family bedside for evaluation of bilateral jaw pain present since yesterday but worsened today.  Denies any fall, injury or trauma.  Patient reports the pain makes it feel like a band when opening her mouth, and though she is able to open her mouth she is not fully able to open.  Patient reports that she has always had clicking and grinding to both jaws at baseline.  Also reports that she does grind her teeth.  Does not use a mouthguard as previously directed by dentist.  Denies any recent sickness or recent issues similarly.  No accompanying fevers, dental pain, cough or congestion symptoms.  States slight scratchy throat but not painful to swallow.  States bilateral ears feel slightly discomfort with this.  States pain to jaw is moderate.  Unresolved with over-the-counter ibuprofen, last took 1 to 2 hours ago.  Has continued to eat and drink well today, but states has to eat soft foods.  Denies other alleviating measures.  Denies other aggravating factors.  Reports otherwise doing well denies other complaints.  No LMP recorded. (Menstrual status: IUD).denies pregnancy    Past Medical History:  Diagnosis Date  . PDA (patent ductus arteriosus)     Patient Active Problem List   Diagnosis Date Noted  . ADD (attention deficit disorder) 06/11/2015    Past Surgical History:  Procedure Laterality Date  . CARDIAC SURGERY    . PATENT DUCTUS ARTERIOUS REPAIR       No current facility-administered medications for this encounter.   Current Outpatient Medications:  .  levonorgestrel (MIRENA) 20 MCG/24HR IUD, 1 each by Intrauterine route once., Disp: , Rfl:  .  cyclobenzaprine (FLEXERIL) 10 MG tablet, Take 1 tablet (10 mg total) by  mouth at bedtime as needed for muscle spasms. Do not drive while taking as can cause drowsiness, Disp: 7 tablet, Rfl: 0 .  ibuprofen (ADVIL,MOTRIN) 800 MG tablet, Take 1 tablet (800 mg total) by mouth every 8 (eight) hours as needed., Disp: 30 tablet, Rfl: 0 .  meloxicam (MOBIC) 15 MG tablet, Take 1 tablet (15 mg total) by mouth daily as needed., Disp: 10 tablet, Rfl: 0  Allergies Patient has no known allergies.  History reviewed. No pertinent family history.  Social History Social History   Tobacco Use  . Smoking status: Current Every Day Smoker    Types: Cigarettes  . Smokeless tobacco: Never Used  Substance Use Topics  . Alcohol use: No    Alcohol/week: 0.0 standard drinks  . Drug use: No    Review of Systems Constitutional: No fever ENT: As above.  Cardiovascular: Denies chest pain. Respiratory: Denies shortness of breath. Gastrointestinal: No abdominal pain.   Musculoskeletal: Negative for back pain. Skin: Negative for rash. Neurological: Negative for focal weakness or numbness.  Reports some intermittent headaches with this.  States she frequently has tension headaches similarly.   ____________________________________________   PHYSICAL EXAM:  VITAL SIGNS: ED Triage Vitals  Enc Vitals Group     BP 10/14/18 1747 110/69     Pulse Rate 10/14/18 1747 86     Resp 10/14/18 1747 14     Temp 10/14/18 1747 98.1 F (36.7 C)     Temp Source 10/14/18 1747 Oral     SpO2 10/14/18 1747 100 %  Weight 10/14/18 1745 130 lb (59 kg)     Height 10/14/18 1745 5\' 6"  (1.676 m)     Head Circumference --      Peak Flow --      Pain Score 10/14/18 1744 8     Pain Loc --      Pain Edu? --      Excl. in GC? --     Constitutional: Alert and oriented. Well appearing and in no acute distress. Eyes: Conjunctivae are normal.  Head: Atraumatic. No sinus tenderness to palpation. No swelling. No erythema.  Bilateral TMJ tenderness left greater than right.  Unable to fully open mouth,  but able to open at least half.  Ears: no erythema, normal TMs bilaterally.  No mastoid tenderness bilaterally.  Nose:No nasal congestion   Mouth/Throat: Mucous membranes are moist. No pharyngeal erythema. No tonsillar swelling or exudate. No dental tenderness.  Neck: No stridor.  No cervical spine tenderness to palpation. Hematological/Lymphatic/Immunilogical: No cervical lymphadenopathy. Cardiovascular: Normal rate, regular rhythm. Grossly normal heart sounds.  Good peripheral circulation. Respiratory: Normal respiratory effort.  No retractions. No wheezes, rales or rhonchi. Good air movement.  Musculoskeletal: Ambulatory with steady gait.  Neurologic:  Normal speech and language. No gait instability. Skin:  Skin appears warm, dry and intact. No rash noted. Psychiatric: Mood and affect are normal. Speech and behavior are normal.  ___________________________________________   LABS (all labs ordered are listed, but only abnormal results are displayed)  Labs Reviewed - No data to display ____________________________________________   PROCEDURES Procedures    INITIAL IMPRESSION / ASSESSMENT AND PLAN / ED COURSE  Pertinent labs & imaging results that were available during my care of the patient were reviewed by me and considered in my medical decision making (see chart for details).  Well-appearing patient.  No acute distress.  Suspect acute TMJ pain.  Patient reports has had recurrent grinding and popping as well as grinds her teeth for years.  Recommend to have this closely followed and use a mouthguard and follow-up with dentist and primary.  30 mg IM Toradol as well as 60 mg Norflex given IM once in urgent care.  Will treat with Mobic and nightly PRN Flexeril as needed.  Discussed soft food diet, supportive care and close follow-up.  Discussed return parameters.Discussed indication, risks and benefits of medications with patient.  Discussed follow up with Primary care physician this  week. Discussed follow up and return parameters including no resolution or any worsening concerns. Patient verbalized understanding and agreed to plan.   ____________________________________________   FINAL CLINICAL IMPRESSION(S) / ED DIAGNOSES  Final diagnoses:  Bilateral temporomandibular joint pain     ED Discharge Orders         Ordered    meloxicam (MOBIC) 15 MG tablet  Daily PRN     10/14/18 1817    cyclobenzaprine (FLEXERIL) 10 MG tablet  At bedtime PRN     10/14/18 1817           Note: This dictation was prepared with Dragon dictation along with smaller phrase technology. Any transcriptional errors that result from this process are unintentional.         Renford DillsMiller, Anayeli Arel, NP 10/14/18 2027

## 2018-12-30 ENCOUNTER — Ambulatory Visit
Admission: EM | Admit: 2018-12-30 | Discharge: 2018-12-30 | Disposition: A | Discharge status: Home / Self Care | Payer: 59 | Attending: Family Medicine | Admitting: Family Medicine

## 2018-12-30 DIAGNOSIS — L739 Follicular disorder, unspecified: Secondary | ICD-10-CM | POA: Diagnosis not present

## 2018-12-30 MED ORDER — DICLOXACILLIN SODIUM 500 MG PO CAPS: 500.0000 mg | ORAL_CAPSULE | Freq: Three times a day (TID) | ORAL | 0 refills | Status: DC

## 2018-12-30 NOTE — ED Triage Notes (Addendum)
Pt here for rash for 1 week that started on her chest and is now traveling upward into her neck and scalp which is also itching. Has been taking benadryl which helps the symptoms temporarily. No new soaps or detergents.

## 2018-12-30 NOTE — ED Provider Notes (Signed)
[facility]    CSN: [id number] Arrival date & time: 12/30/18  1816     History   Chief Complaint Chief Complaint  Patient presents with  . Rash    HPI Maureen Hill is a 28 y.o. female.   The history is provided by the patient.  Rash  Location:  Torso Torso rash location:  L chest and R chest Quality: redness   Severity:  Moderate Onset quality:  Sudden Duration:  1 week Timing:  Constant Progression:  Worsening Chronicity:  New Context: not animal contact, not chemical exposure, not diapers, not eggs, not exposure to similar rash, not food, not insect bite/sting, not medications, not new detergent/soap, not nuts, not plant contact, not pollen, not pregnancy, not sick contacts and not sun exposure   Relieved by:  Antihistamines Associated symptoms: no abdominal pain, no diarrhea, no fatigue, no fever, no headaches, no hoarse voice, no induration, no joint pain, no myalgias, no nausea, no periorbital edema, no shortness of breath, no sore throat, no throat swelling, no tongue swelling, no URI, not vomiting and not wheezing     Past Medical History:  Diagnosis Date  . PDA (patent ductus arteriosus)     Patient Active Problem List   Diagnosis Date Noted  . ADD (attention deficit disorder) 06/11/2015    Past Surgical History:  Procedure Laterality Date  . CARDIAC SURGERY    . PATENT DUCTUS ARTERIOUS REPAIR      OB History   No obstetric history on file.      Home Medications    Prior to Admission medications   Medication Sig Start Date End Date Taking? Authorizing Provider  levonorgestrel (MIRENA) 20 MCG/24HR IUD 1 each by Intrauterine route once.   Yes [provider]  cyclobenzaprine (FLEXERIL) 10 MG tablet Take 1 tablet (10 mg total) by mouth at bedtime as needed for muscle spasms. Do not drive while taking as can cause drowsiness 10/14/18   Renford DillsMiller, Lindsey, NP  dicloxacillin (DYNAPEN) 500 MG capsule Take 1 capsule (500 mg total)  by mouth 3 (three) times daily. 12/30/18   Payton Mccallumonty, Margaret Staggs, MD  ibuprofen (ADVIL,MOTRIN) 800 MG tablet Take 1 tablet (800 mg total) by mouth every 8 (eight) hours as needed. 08/06/15   Beers, Charmayne Sheerharles M, PA-C  meloxicam (MOBIC) 15 MG tablet Take 1 tablet (15 mg total) by mouth daily as needed. 10/14/18   Renford DillsMiller, Lindsey, NP    Family History Family History  Problem Relation Age of Onset  . Healthy Mother   . Healthy Father     Social History Social History   Tobacco Use  . Smoking status: Current Every Day Smoker    Types: Cigarettes  . Smokeless tobacco: Never Used  Substance Use Topics  . Alcohol use: No    Alcohol/week: 0.0 standard drinks  . Drug use: No     Allergies   Patient has no known allergies.   Review of Systems Review of Systems  Constitutional: Negative for fatigue and fever.  HENT: Negative for hoarse voice and sore throat.   Respiratory: Negative for shortness of breath and wheezing.   Gastrointestinal: Negative for abdominal pain, diarrhea, nausea and vomiting.  Musculoskeletal: Negative for arthralgias and myalgias.  Skin: Positive for rash.  Neurological: Negative for headaches.     Physical Exam Triage Vital Signs ED Triage Vitals  Enc Vitals Group     BP 12/30/18 1841 128/77     Pulse Rate 12/30/18 1841 90     Resp  12/30/18 1841 18     Temp 12/30/18 1841 98.5 F (36.9 C)     Temp Source 12/30/18 1841 Oral     SpO2 12/30/18 1841 100 %     Weight 12/30/18 1843 115 lb (52.2 kg)     Height 12/30/18 1843 5\' 5"  (1.651 m)     Head Circumference --      Peak Flow --      Pain Score 12/30/18 1843 0     Pain Loc --      Pain Edu? --      Excl. in GC? --    No data found.  Updated Vital Signs BP 128/77 (BP Location: Left Arm)   Pulse 90   Temp 98.5 F (36.9 C) (Oral)   Resp 18   Ht 5\' 5"  (1.651 m)   Wt 52.2 kg   SpO2 100%   BMI 19.14 kg/m   Visual Acuity Right Eye Distance:   Left Eye Distance:   Bilateral Distance:    Right Eye  Near:   Left Eye Near:    Bilateral Near:     Physical Exam Vitals signs and nursing note reviewed.  Constitutional:      General: She is not in acute distress.    Appearance: She is not toxic-appearing or diaphoretic.  Skin:    Findings: Rash present. Rash is papular and pustular.  Neurological:     Mental Status: She is alert.      UC Treatments / Results  Labs (all labs ordered are listed, but only abnormal results are displayed) Labs Reviewed - No data to display  EKG None  Radiology No results found.  Procedures Procedures (including critical care time)  Medications Ordered in UC Medications - No data to display  Initial Impression / Assessment and Plan / UC Course  I have reviewed the triage vital signs and the nursing notes.  Pertinent labs & imaging results that were available during my care of the patient were reviewed by me and considered in my medical decision making (see chart for details).      Final Clinical Impressions(s) / UC Diagnoses   Final diagnoses:  Folliculitis    ED Prescriptions    Medication Sig Dispense Auth. Provider   dicloxacillin (DYNAPEN) 500 MG capsule Take 1 capsule (500 mg total) by mouth 3 (three) times daily. 30 capsule Payton Mccallumonty, Keelan Pomerleau, MD     1. diagnosis reviewed with patient 2. rx as per orders above; reviewed possible side effects, interactions, risks and benefits   3. Follow-up prn if symptoms worsen or don't improve   Controlled Substance Prescriptions [place] Controlled Substance Registry consulted? Not Applicable   Payton Mccallumonty, Len Azeez, MD 12/30/18 [phone]

## 2019-04-24 ENCOUNTER — Telehealth: Payer: Self-pay

## 2019-04-24 NOTE — Telephone Encounter (Signed)
Coronavirus (COVID-19) Are you at risk?  Are you at risk for the Coronavirus (COVID-19)?  To be considered HIGH RISK for Coronavirus (COVID-19), you have to meet the following criteria:  . Traveled to [place], [place], [place], [place] or [place]; or in the [place] to [place], [place], [place], or [place]; and have fever, cough, and shortness of breath within the last 2 weeks of travel OR . Been in close contact with a person diagnosed with COVID-19 within the last 2 weeks and have fever, cough, and shortness of breath . IF YOU DO NOT MEET THESE CRITERIA, YOU ARE CONSIDERED LOW RISK FOR COVID-19.  What to do if you are HIGH RISK for COVID-19?  . If you are having a medical emergency, call 911. . Seek medical care right away. Before you go to a doctor's office, urgent care or emergency department, call ahead and tell them about your recent travel, contact with someone diagnosed with COVID-19, and your symptoms. You should receive instructions from your physician's office regarding next steps of care.  . When you arrive at healthcare provider, tell the healthcare staff immediately you have returned from visiting [place], [place], [place], [place] or [place]; or traveled in the [place] to [place], [place], [place], or [place]; in the last two weeks or you have been in close contact with a person diagnosed with COVID-19 in the last 2 weeks.   . Tell the health care staff about your symptoms: fever, cough and shortness of breath. . After you have been seen by a medical provider, you will be either: o Tested for (COVID-19) and discharged home on quarantine except to seek medical care if symptoms worsen, and asked to  - Stay home and avoid contact with others until you get your results (4-5 days)  - Avoid travel on public transportation if possible (such as bus, train, or airplane) or o Sent to the Emergency Department by EMS for evaluation, COVID-19 testing, and possible  admission depending on your condition and test results.  What to do if you are LOW RISK for COVID-19?  Reduce your risk of any infection by using the same precautions used for avoiding the common cold or flu:  . Wash your hands often with soap and warm water for at least 20 seconds.  If soap and water are not readily available, use an alcohol-based hand sanitizer with at least 60% alcohol.  . If coughing or sneezing, cover your mouth and nose by coughing or sneezing into the elbow areas of your shirt or coat, into a tissue or into your sleeve (not your hands). . Avoid shaking hands with others and consider head nods or verbal greetings only. . Avoid touching your eyes, nose, or mouth with unwashed hands.  . Avoid close contact with people who are Keshan Reha. . Avoid places or events with large numbers of people in one location, like concerts or sporting events. . Carefully consider travel plans you have or are making. . If you are planning any travel outside or inside the [place], visit the CDC's Travelers' Health webpage for the latest health notices. . If you have some symptoms but not all symptoms, continue to monitor at home and seek medical attention if your symptoms worsen. . If you are having a medical emergency, call 911.  04/24/19 SCREENING NEG SLS ADDITIONAL HEALTHCARE OPTIONS FOR PATIENTS  [facility] / e-Visit: [email or web address]         [facility]: [phone]    [facility]: [phone]                   [facility]: [phone]  

## 2019-04-25 ENCOUNTER — Ambulatory Visit (INDEPENDENT_AMBULATORY_CARE_PROVIDER_SITE_OTHER): Payer: 59 | Admitting: Obstetrics and Gynecology

## 2019-04-25 ENCOUNTER — Other Ambulatory Visit: Payer: Self-pay

## 2019-04-25 ENCOUNTER — Encounter: Payer: Self-pay | Admitting: Obstetrics and Gynecology

## 2019-04-25 VITALS — BP 114/78 | HR 95 | Ht 65.0 in | Wt 136.3 lb

## 2019-04-25 DIAGNOSIS — Z30433 Encounter for removal and reinsertion of intrauterine contraceptive device: Secondary | ICD-10-CM

## 2019-04-25 DIAGNOSIS — F988 Other specified behavioral and emotional disorders with onset usually occurring in childhood and adolescence: Secondary | ICD-10-CM

## 2019-04-25 MED ORDER — AMPHETAMINE-DEXTROAMPHETAMINE 10 MG PO TABS: 10.0000 mg | ORAL_TABLET | Freq: Two times a day (BID) | ORAL | 0 refills | Status: DC

## 2019-04-25 NOTE — Progress Notes (Signed)
Maureen Hill is a 28 y.o. year old No obstetric history on file. Caucasian female who presents for removal and replacement of a Mirena IUD. She was given informed consent for removal and reinsertion of her Mirena. Her Mirena was placed 2014, No LMP recorded. (Menstrual status: IUD)., and her pregnancy test today was negative.   The risks and benefits of the method and placement have been thouroughly reviewed with the patient and all questions were answered.  Specifically the patient is aware of failure rate of 11/998, expulsion of the IUD and of possible perforation.  The patient is aware of irregular bleeding due to the method and understands the incidence of irregular bleeding diminishes with time.  Signed copy of informed consent in chart.   No LMP recorded. (Menstrual status: IUD). BP 114/78   Pulse 95   Ht 5\' 5"  (1.651 m)   Wt 136 lb 4.8 oz (61.8 kg)   BMI 22.68 kg/m  No results found for this or any previous visit (from the past 24 hour(s)).   Appropriate time out taken. A graves speculum was placed in the vagina.  The cervix was visualized, prepped using Betadine. The strings were visible. They were grasped and the Mirena was easily removed. The cervix was then grasped with a single-tooth tenaculum. The uterus was found to be neutral and it sounded to 9 cm.  Mirena IUD placed per manufacturer's recommendations without complications. The strings were trimmed to 3 cm.  The patient tolerated the procedure well.   The patient was given post procedure instructions, including signs and symptoms of infection and to check for the strings after each menses or each month, and refraining from intercourse or anything in the vagina for 3 days.  She was given a Mirena care card with date Mirena placed, and date Mirena to be removed.   She also wants to restart her adderall. She was off for the last year as she lost her insurance. Prescription given to restart at 10 mg. Will follow up at upcoming  appointment. Gwenlyn Hottinger Suzan Nailer, CNM

## 2019-04-25 NOTE — Patient Instructions (Signed)

## 2019-06-06 ENCOUNTER — Telehealth: Payer: Self-pay | Admitting: *Deleted

## 2019-06-06 NOTE — Telephone Encounter (Signed)
Coronavirus (COVID-19) Are you at risk?  Are you at risk for the Coronavirus (COVID-19)?  To be considered HIGH RISK for Coronavirus (COVID-19), you have to meet the following criteria:  . Traveled to [place], [place], [place], [place] or [place]; or in the [place] to [place], [place], [place], or [place]; and have fever, cough, and shortness of breath within the last 2 weeks of travel OR . Been in close contact with a person diagnosed with COVID-19 within the last 2 weeks and have fever, cough, and shortness of breath . IF YOU DO NOT MEET THESE CRITERIA, YOU ARE CONSIDERED LOW RISK FOR COVID-19.  What to do if you are HIGH RISK for COVID-19?  . If you are having a medical emergency, call 911. . Seek medical care right away. Before you go to a doctor's office, urgent care or emergency department, call ahead and tell them about your recent travel, contact with someone diagnosed with COVID-19, and your symptoms. You should receive instructions from your physician's office regarding next steps of care.  . When you arrive at healthcare provider, tell the healthcare staff immediately you have returned from visiting [place], [place], [place], [place] or [place]; or traveled in the [place] to [place], [place], [place], or [place]; in the last two weeks or you have been in close contact with a person diagnosed with COVID-19 in the last 2 weeks.   . Tell the health care staff about your symptoms: fever, cough and shortness of breath. . After you have been seen by a medical provider, you will be either: o Tested for (COVID-19) and discharged home on quarantine except to seek medical care if symptoms worsen, and asked to  - Stay home and avoid contact with others until you get your results (4-5 days)  - Avoid travel on public transportation if possible (such as bus, train, or airplane) or o Sent to the Emergency Department by EMS for evaluation, COVID-19 testing, and possible  admission depending on your condition and test results.  What to do if you are LOW RISK for COVID-19?  Reduce your risk of any infection by using the same precautions used for avoiding the common cold or flu:  . Wash your hands often with soap and warm water for at least 20 seconds.  If soap and water are not readily available, use an alcohol-based hand sanitizer with at least 60% alcohol.  . If coughing or sneezing, cover your mouth and nose by coughing or sneezing into the elbow areas of your shirt or coat, into a tissue or into your sleeve (not your hands). . Avoid shaking hands with others and consider head nods or verbal greetings only. . Avoid touching your eyes, nose, or mouth with unwashed hands.  . Avoid close contact with people who are sick. . Avoid places or events with large numbers of people in one location, like concerts or sporting events. . Carefully consider travel plans you have or are making. . If you are planning any travel outside or inside the [place], visit the CDC's Travelers' Health webpage for the latest health notices. . If you have some symptoms but not all symptoms, continue to monitor at home and seek medical attention if your symptoms worsen. . If you are having a medical emergency, call 911.   ADDITIONAL HEALTHCARE OPTIONS FOR PATIENTS  [facility] / e-Visit: [email or web address]         [facility]: [phone]  [facility]: [phone]                   [facility]: [phone]   Spoke with pt denies any sx.   , CMA 

## 2019-06-07 ENCOUNTER — Other Ambulatory Visit: Payer: Self-pay

## 2019-06-07 ENCOUNTER — Other Ambulatory Visit (HOSPITAL_COMMUNITY)
Admission: RE | Admit: 2019-06-07 | Discharge: 2019-06-07 | Disposition: A | Discharge status: Home / Self Care | Payer: 59 | Source: Ambulatory Visit | Attending: Obstetrics and Gynecology | Admitting: Obstetrics and Gynecology

## 2019-06-07 ENCOUNTER — Encounter: Payer: Self-pay | Admitting: Obstetrics and Gynecology

## 2019-06-07 ENCOUNTER — Ambulatory Visit (INDEPENDENT_AMBULATORY_CARE_PROVIDER_SITE_OTHER): Payer: 59 | Admitting: Obstetrics and Gynecology

## 2019-06-07 VITALS — BP 100/60 | HR 100 | Ht 66.0 in | Wt 133.5 lb

## 2019-06-07 DIAGNOSIS — F17200 Nicotine dependence, unspecified, uncomplicated: Secondary | ICD-10-CM

## 2019-06-07 DIAGNOSIS — R300 Dysuria: Secondary | ICD-10-CM | POA: Diagnosis not present

## 2019-06-07 DIAGNOSIS — Z01419 Encounter for gynecological examination (general) (routine) without abnormal findings: Secondary | ICD-10-CM | POA: Diagnosis not present

## 2019-06-07 DIAGNOSIS — Z30431 Encounter for routine checking of intrauterine contraceptive device: Secondary | ICD-10-CM

## 2019-06-07 DIAGNOSIS — F988 Other specified behavioral and emotional disorders with onset usually occurring in childhood and adolescence: Secondary | ICD-10-CM

## 2019-06-07 LAB — POCT URINALYSIS DIPSTICK
Bilirubin, UA: NEGATIVE
Glucose, UA: NEGATIVE
Ketones, UA: NEGATIVE
Leukocytes, UA: NEGATIVE
Nitrite, UA: NEGATIVE
Protein, UA: NEGATIVE
Spec Grav, UA: 1.015 (ref 1.010–1.025)
Urobilinogen, UA: 0.2 E.U./dL
pH, UA: 7 (ref 5.0–8.0)

## 2019-06-07 MED ORDER — AMPHETAMINE-DEXTROAMPHETAMINE 20 MG PO TABS: 20.0000 mg | ORAL_TABLET | Freq: Two times a day (BID) | ORAL | 0 refills | Status: AC

## 2019-06-07 MED ORDER — AMPHETAMINE-DEXTROAMPHETAMINE 20 MG PO TABS: 20.0000 mg | ORAL_TABLET | Freq: Two times a day (BID) | ORAL | 0 refills | Status: DC

## 2019-06-07 NOTE — Progress Notes (Signed)
Subjective:     Maureen Hill is a caucasian 28 y.o. female separated and is here for a comprehensive physical exam. 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 The patient reports problems - urinary urgency, cloudy, dysuria, foul odor.  Discussed smoking cessation. Pt reports gradually weaning off of cigarettes, down from 1 pack/day to 1/2 pack per day. Denies exercise at this time. Pt reports no changes in family hx or social situation. IUD in place, happy with it. Denies concern for STIs.   Social History   Socioeconomic History  . Marital status: Married    Spouse name: Not on file  . Number of children: Not on file  . Years of education: Not on file  . Highest education level: Not on file  Occupational History  . Not on file  Social Needs  . Financial resource strain: Not on file  . Food insecurity    Worry: Not on file    Inability: Not on file  . Transportation needs    Medical: Not on file    Non-medical: Not on file  Tobacco Use  . Smoking status: Current Every Day Smoker    Types: Cigarettes  . Smokeless tobacco: Never Used  Substance and Sexual Activity  . Alcohol use: No    Alcohol/week: 0.0 standard drinks  . Drug use: No  . Sexual activity: Yes    Birth control/protection: I.U.D.    Comment: Mirena  Lifestyle  . Physical activity    Days per week: Not on file    Minutes per session: Not on file  . Stress: Not on file  Relationships  . Social Musicianconnections    Talks on phone: Not on file    Gets together: Not on file    Attends religious service: Not on file    Active member of club or organization: Not on file    Attends meetings of clubs or organizations: Not on file    Relationship status: Not on file  . Intimate partner violence    Fear of current or ex partner: Not on file    Emotionally abused: Not on file    Physically abused: Not on file    Forced sexual activity: Not on file  Other Topics Concern  . Not on file  Social History Narrative  .  Not on file   Health Maintenance  Topic Date Due  . HIV Screening  09/10/2004  . TETANUS/TDAP  09/10/2008  . PAP-Cervical Cytology Screening  09/10/2010  . PAP SMEAR-Modifier  09/10/2010  . INFLUENZA VACCINE  06/24/2019    The following portions of the patient's history were reviewed and updated as appropriate: allergies, current medications, past family history, past medical history, past social history, past surgical history and problem list.  Review of Systems Pertinent items are noted in HPI.   Objective:    BP 100/60   Pulse 100   Ht 5\' 6"  (1.676 m)   Wt 133 lb 8 oz (60.6 kg)   BMI 21.55 kg/m  General appearance: alert, cooperative and appears stated age Neck: no adenopathy, no carotid bruit, no JVD, supple, symmetrical, trachea midline and thyroid not enlarged, symmetric, no tenderness/mass/nodules Lungs: clear to auscultation bilaterally Breasts: normal appearance, no masses or tenderness Heart: regular rate and rhythm, S1, S2 normal, no murmur, click, rub or gallop Abdomen: soft, non-tender; bowel sounds normal; no masses,  no organomegaly Pelvic: cervix normal in appearance, external genitalia normal, no adnexal masses or tenderness, no cervical motion tenderness, rectovaginal septum  normal, uterus normal size, shape, and consistency and vagina normal without discharge Pap with HPV with reflex performed. IUD strings noted.    Urinalysis    Component Value Date/Time   COLORURINE ORANGE (A) 12/04/2017 1232   APPEARANCEUR CLOUDY (A) 12/04/2017 1232   APPEARANCEUR Clear 06/25/2013 2122   LABSPEC  12/04/2017 1232    TEST NOT REPORTED DUE TO COLOR INTERFERENCE OF URINE PIGMENT   LABSPEC 1.024 06/25/2013 2122   PHURINE  12/04/2017 1232    TEST NOT REPORTED DUE TO COLOR INTERFERENCE OF URINE PIGMENT   GLUCOSEU (A) 12/04/2017 1232    TEST NOT REPORTED DUE TO COLOR INTERFERENCE OF URINE PIGMENT   GLUCOSEU Negative 06/25/2013 2122   HGBUR (A) 12/04/2017 1232    TEST NOT  REPORTED DUE TO COLOR INTERFERENCE OF URINE PIGMENT   BILIRUBINUR neg 06/07/2019 0938   BILIRUBINUR Negative 06/25/2013 2122   KETONESUR (A) 12/04/2017 1232    TEST NOT REPORTED DUE TO COLOR INTERFERENCE OF URINE PIGMENT   PROTEINUR Negative 06/07/2019 0938   PROTEINUR (A) 12/04/2017 1232    TEST NOT REPORTED DUE TO COLOR INTERFERENCE OF URINE PIGMENT   UROBILINOGEN 0.2 06/07/2019 0938   NITRITE neg 06/07/2019 0938   NITRITE (A) 12/04/2017 1232    TEST NOT REPORTED DUE TO COLOR INTERFERENCE OF URINE PIGMENT   LEUKOCYTESUR Negative 06/07/2019 0938   LEUKOCYTESUR Trace 06/25/2013 2122    Assessment:    Healthy female exam.  Dysuria ADD Smoker    IUD check   Plan:  Urine sent for culture, will FU accordingly Pap performed Adderral dose increased to 20 mcg BID for 2 months until able to see new PCP Decreasing smoking, down from 1 pack a day to 1/2 pack per day.  FU in 1 yr for AE or sooner if needed  Silvestre Mesi, SNM  See After Visit Summary for Counseling Recommendations

## 2019-06-07 NOTE — Patient Instructions (Signed)
 Preventive Care 21-30 Years Old, Female Preventive care refers to visits with your health care provider and lifestyle choices that can promote health and wellness. This includes:  A yearly physical exam. This may also be called an annual well check.  Regular dental visits and eye exams.  Immunizations.  Screening for certain conditions.  Healthy lifestyle choices, such as eating a healthy diet, getting regular exercise, not using drugs or products that contain nicotine and tobacco, and limiting alcohol use. What can I expect for my preventive care visit? Physical exam Your health care provider will check your:  Height and weight. This may be used to calculate body mass index (BMI), which tells if you are at a healthy weight.  Heart rate and blood pressure.  Skin for abnormal spots. Counseling Your health care provider may ask you questions about your:  Alcohol, tobacco, and drug use.  Emotional well-being.  Home and relationship well-being.  Sexual activity.  Eating habits.  Work and work environment.  Method of birth control.  Menstrual cycle.  Pregnancy history. What immunizations do I need?  Influenza (flu) vaccine  This is recommended every year. Tetanus, diphtheria, and pertussis (Tdap) vaccine  You may need a Td booster every 10 years. Varicella (chickenpox) vaccine  You may need this if you have not been vaccinated. Human papillomavirus (HPV) vaccine  If recommended by your health care provider, you may need three doses over 6 months. Measles, mumps, and rubella (MMR) vaccine  You may need at least one dose of MMR. You may also need a second dose. Meningococcal conjugate (MenACWY) vaccine  One dose is recommended if you are age 19-21 years and a first-year college student living in a residence hall, or if you have one of several medical conditions. You may also need additional booster doses. Pneumococcal conjugate (PCV13) vaccine  You may need  this if you have certain conditions and were not previously vaccinated. Pneumococcal polysaccharide (PPSV23) vaccine  You may need one or two doses if you smoke cigarettes or if you have certain conditions. Hepatitis A vaccine  You may need this if you have certain conditions or if you travel or work in places where you may be exposed to hepatitis A. Hepatitis B vaccine  You may need this if you have certain conditions or if you travel or work in places where you may be exposed to hepatitis B. Haemophilus influenzae type b (Hib) vaccine  You may need this if you have certain conditions. You may receive vaccines as individual doses or as more than one vaccine together in one shot (combination vaccines). Talk with your health care provider about the risks and benefits of combination vaccines. What tests do I need?  Blood tests  Lipid and cholesterol levels. These may be checked every 5 years starting at age 20.  Hepatitis C test.  Hepatitis B test. Screening  Diabetes screening. This is done by checking your blood sugar (glucose) after you have not eaten for a while (fasting).  Sexually transmitted disease (STD) testing.  BRCA-related cancer screening. This may be done if you have a family history of breast, ovarian, tubal, or peritoneal cancers.  Pelvic exam and Pap test. This may be done every 3 years starting at age 21. Starting at age 30, this may be done every 5 years if you have a Pap test in combination with an HPV test. Talk with your health care provider about your test results, treatment options, and if necessary, the need for more   tests. Follow these instructions at home: Eating and drinking   Eat a diet that includes fresh fruits and vegetables, whole grains, lean protein, and low-fat dairy.  Take vitamin and mineral supplements as recommended by your health care provider.  Do not drink alcohol if: ? Your health care provider tells you not to drink. ? You are  pregnant, may be pregnant, or are planning to become pregnant.  If you drink alcohol: ? Limit how much you have to 0-1 drink a day. ? Be aware of how much alcohol is in your drink. In the [place] one drink equals one 12 oz bottle of beer (355 mL), one 5 oz glass of wine (148 mL), or one 1 oz glass of hard liquor (44 mL). Lifestyle  Take daily care of your teeth and gums.  Stay active. Exercise for at least 30 minutes on 5 or more days each week.  Do not use any products that contain nicotine or tobacco, such as cigarettes, e-cigarettes, and chewing tobacco. If you need help quitting, ask your health care provider.  If you are sexually active, practice safe sex. Use a condom or other form of birth control (contraception) in order to prevent pregnancy and STIs (sexually transmitted infections). If you plan to become pregnant, see your health care provider for a preconception visit. What's next?  Visit your health care provider once a year for a well check visit.  Ask your health care provider how often you should have your eyes and teeth checked.  Stay up to date on all vaccines. This information is not intended to replace advice given to you by your health care provider. Make sure you discuss any questions you have with your health care provider. Document Released: 01/05/2002 Document Revised: 07/21/2018 Document Reviewed: 07/21/2018 Elsevier Patient Education  2020 [employer].

## 2019-06-08 LAB — CYTOLOGY - PAP
Chlamydia: NEGATIVE
Diagnosis: NEGATIVE
Neisseria Gonorrhea: NEGATIVE

## 2019-06-09 ENCOUNTER — Other Ambulatory Visit: Payer: Self-pay | Admitting: Obstetrics and Gynecology

## 2019-06-09 LAB — URINE CULTURE

## 2019-06-09 MED ORDER — NITROFURANTOIN MONOHYD MACRO 100 MG PO CAPS: 100.0000 mg | ORAL_CAPSULE | Freq: Two times a day (BID) | ORAL | 1 refills | Status: AC
